# Patient Record
Sex: Female | Born: 1980 | Race: White | Hispanic: No | Marital: Married
Health system: Southern US, Community
[De-identification: ages and names within clinical notes are randomized; demographics above are authoritative.]

---

## 2007-07-17 ENCOUNTER — Ambulatory Visit: Payer: Self-pay | Admitting: Sports Medicine

## 2008-05-19 ENCOUNTER — Observation Stay: Payer: Self-pay | Admitting: Obstetrics and Gynecology

## 2008-06-14 ENCOUNTER — Observation Stay: Payer: Self-pay | Admitting: Obstetrics and Gynecology

## 2008-06-17 ENCOUNTER — Observation Stay: Payer: Self-pay | Admitting: Obstetrics and Gynecology

## 2008-06-21 ENCOUNTER — Observation Stay: Payer: Self-pay | Admitting: Obstetrics and Gynecology

## 2008-06-25 ENCOUNTER — Observation Stay: Payer: Self-pay | Admitting: Obstetrics and Gynecology

## 2008-06-28 ENCOUNTER — Observation Stay: Payer: Self-pay | Admitting: Obstetrics and Gynecology

## 2008-07-01 ENCOUNTER — Observation Stay: Payer: Self-pay

## 2008-07-05 ENCOUNTER — Observation Stay: Payer: Self-pay | Admitting: Obstetrics and Gynecology

## 2008-07-08 ENCOUNTER — Observation Stay: Payer: Self-pay | Admitting: Obstetrics and Gynecology

## 2008-07-12 ENCOUNTER — Observation Stay: Payer: Self-pay | Admitting: Obstetrics and Gynecology

## 2008-07-15 ENCOUNTER — Observation Stay: Payer: Self-pay

## 2008-07-19 ENCOUNTER — Observation Stay: Payer: Self-pay | Admitting: Obstetrics and Gynecology

## 2008-07-22 ENCOUNTER — Observation Stay: Payer: Self-pay

## 2008-07-26 ENCOUNTER — Observation Stay: Payer: Self-pay

## 2008-07-29 ENCOUNTER — Observation Stay: Payer: Self-pay | Admitting: Obstetrics and Gynecology

## 2008-08-01 ENCOUNTER — Observation Stay: Payer: Self-pay | Admitting: Obstetrics and Gynecology

## 2008-08-05 ENCOUNTER — Observation Stay: Payer: Self-pay | Admitting: Obstetrics and Gynecology

## 2008-08-09 ENCOUNTER — Observation Stay: Payer: Self-pay | Admitting: Obstetrics and Gynecology

## 2008-08-12 ENCOUNTER — Ambulatory Visit: Payer: Self-pay | Admitting: Obstetrics and Gynecology

## 2008-08-13 ENCOUNTER — Inpatient Hospital Stay: Payer: Self-pay | Admitting: Obstetrics and Gynecology

## 2012-04-07 ENCOUNTER — Encounter: Payer: Self-pay | Admitting: Obstetrics and Gynecology

## 2012-06-10 ENCOUNTER — Encounter: Payer: Self-pay | Admitting: Pediatrics

## 2012-08-06 ENCOUNTER — Observation Stay: Payer: Self-pay | Admitting: Obstetrics and Gynecology

## 2012-08-12 ENCOUNTER — Observation Stay: Payer: Self-pay | Admitting: Obstetrics and Gynecology

## 2012-08-20 ENCOUNTER — Observation Stay: Payer: Self-pay

## 2012-08-23 ENCOUNTER — Observation Stay: Payer: Self-pay | Admitting: Obstetrics and Gynecology

## 2012-08-26 ENCOUNTER — Observation Stay: Payer: Self-pay

## 2012-08-29 ENCOUNTER — Observation Stay: Payer: Self-pay | Admitting: Obstetrics and Gynecology

## 2012-09-03 ENCOUNTER — Observation Stay: Payer: Self-pay | Admitting: Obstetrics and Gynecology

## 2012-09-05 ENCOUNTER — Observation Stay: Payer: Self-pay | Admitting: Obstetrics and Gynecology

## 2012-09-09 ENCOUNTER — Observation Stay: Payer: Self-pay | Admitting: Obstetrics and Gynecology

## 2012-09-12 ENCOUNTER — Observation Stay: Payer: Self-pay

## 2012-09-16 ENCOUNTER — Observation Stay: Payer: Self-pay | Admitting: Obstetrics and Gynecology

## 2012-09-19 ENCOUNTER — Observation Stay: Payer: Self-pay

## 2012-09-23 ENCOUNTER — Observation Stay: Payer: Self-pay

## 2012-09-25 ENCOUNTER — Ambulatory Visit: Payer: Self-pay | Admitting: Obstetrics and Gynecology

## 2012-09-25 LAB — CBC WITH DIFFERENTIAL/PLATELET
Basophil #: 0 10*3/uL (ref 0.0–0.1)
Eosinophil %: 0.4 %
HCT: 37.3 % (ref 35.0–47.0)
HGB: 13.3 g/dL (ref 12.0–16.0)
Lymphocyte %: 19.8 %
MCH: 31.9 pg (ref 26.0–34.0)
MCHC: 35.7 g/dL (ref 32.0–36.0)
Monocyte #: 0.8 x10 3/mm (ref 0.2–0.9)
Monocyte %: 9.1 %
Neutrophil #: 6 10*3/uL (ref 1.4–6.5)
Platelet: 163 10*3/uL (ref 150–440)
RBC: 4.17 10*6/uL (ref 3.80–5.20)
RDW: 13.8 % (ref 11.5–14.5)
WBC: 8.5 10*3/uL (ref 3.6–11.0)

## 2012-09-26 ENCOUNTER — Inpatient Hospital Stay: Payer: Self-pay | Admitting: Obstetrics and Gynecology

## 2012-09-27 LAB — HEMATOCRIT: HCT: 33.9 % — ABNORMAL LOW (ref 35.0–47.0)

## 2014-04-08 ENCOUNTER — Ambulatory Visit: Payer: Self-pay | Admitting: Internal Medicine

## 2014-04-08 LAB — CBC CANCER CENTER
Basophil #: 0 x10 3/mm (ref 0.0–0.1)
Basophil %: 0.5 %
EOS PCT: 0.5 %
Eosinophil #: 0 x10 3/mm (ref 0.0–0.7)
HCT: 29 % — AB (ref 35.0–47.0)
HGB: 8.7 g/dL — AB (ref 12.0–16.0)
LYMPHS ABS: 1.5 x10 3/mm (ref 1.0–3.6)
LYMPHS PCT: 34 %
Lymphocyte %: 31.1 %
MCH: 19.4 pg — ABNORMAL LOW (ref 26.0–34.0)
MCHC: 30.1 g/dL — ABNORMAL LOW (ref 32.0–36.0)
MCV: 64 fL — AB (ref 80–100)
METAMYELOCYTE: 1 %
MONOS PCT: 9 %
MONOS PCT: 9 %
Monocyte #: 0.4 x10 3/mm (ref 0.2–0.9)
NEUTROS ABS: 2.8 x10 3/mm (ref 1.4–6.5)
Neutrophil %: 58.9 %
PLATELETS: 309 x10 3/mm (ref 150–440)
RBC: 4.5 10*6/uL (ref 3.80–5.20)
RDW: 18.4 % — ABNORMAL HIGH (ref 11.5–14.5)
Segmented Neutrophils: 56 %
WBC: 4.8 x10 3/mm (ref 3.6–11.0)

## 2014-04-08 LAB — RETICULOCYTES
Absolute Retic Count: 0.079 10*6/uL (ref 0.019–0.186)
RETICULOCYTE: 1.7 % (ref 0.4–3.1)

## 2014-04-08 LAB — FOLATE: FOLIC ACID: 19.4 ng/mL — AB (ref 3.1–17.5)

## 2014-04-08 LAB — IRON AND TIBC
IRON BIND. CAP.(TOTAL): 540 ug/dL — AB (ref 250–450)
IRON: 18 ug/dL — AB (ref 50–170)
Iron Saturation: 3 %
UNBOUND IRON-BIND. CAP.: 522 ug/dL

## 2014-04-08 LAB — LACTATE DEHYDROGENASE: LDH: 123 U/L (ref 81–246)

## 2014-04-22 LAB — CBC CANCER CENTER
Basophil #: 0 x10 3/mm (ref 0.0–0.1)
Basophil %: 0.3 %
EOS PCT: 0.4 %
Eosinophil #: 0 x10 3/mm (ref 0.0–0.7)
HCT: 36.4 % (ref 35.0–47.0)
HGB: 11.2 g/dL — ABNORMAL LOW (ref 12.0–16.0)
LYMPHS ABS: 1.5 x10 3/mm (ref 1.0–3.6)
LYMPHS PCT: 23.7 %
MCH: 21.5 pg — ABNORMAL LOW (ref 26.0–34.0)
MCHC: 30.7 g/dL — ABNORMAL LOW (ref 32.0–36.0)
MCV: 70 fL — AB (ref 80–100)
Monocyte #: 0.5 x10 3/mm (ref 0.2–0.9)
Monocyte %: 8.1 %
NEUTROS ABS: 4.2 x10 3/mm (ref 1.4–6.5)
Neutrophil %: 67.5 %
Platelet: 238 x10 3/mm (ref 150–440)
RBC: 5.19 10*6/uL (ref 3.80–5.20)
RDW: 26.5 % — AB (ref 11.5–14.5)
WBC: 6.3 x10 3/mm (ref 3.6–11.0)

## 2014-04-26 ENCOUNTER — Ambulatory Visit: Payer: Self-pay | Admitting: Internal Medicine

## 2014-07-16 NOTE — Op Note (Signed)
PATIENT NAME:  Cassandra Buck, GEPPERT MR#:  [id number] DATE OF BIRTH:  11-08-1980  DATE OF PROCEDURE:  09/25/2012  PREOPERATIVE DIAGNOSIS:  Twin gestation, 48 + 1 weeks' estimated gestational age.   POSTOPERATIVE DIAGNOSES: Twin gestation, 32 + 1 weeks' estimated gestational age.   PROCEDURE:  A repeat low transverse cesarean section.   ANESTHESIA:  Spinal.   SURGEON:  Suzy Bouchard, M.D.   FIRST ASSISTANT:  Milon Score, certified nurse midwife.   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 The patient's estimated date of confinement is 10/09/2012. The patient has elected for a repeat cesarean section.   PROCEDURE:  After adequate spinal anesthesia, the patient was placed in the dorsal supine position with a hip roll under the right side. The patient's abdomen was prepped and draped in normal sterile fashion. A Pfannenstiel incision was made 2 fingerbreadths above the symphysis pubis. Sharp dissection was used to identify the fascia. The fascia was opened in the midline and opened in a transverse fashion. The superior aspect of the fascia was grasped with Kocher clamps and the recti muscles dissected free. The inferior aspect of the fascia was grasped with Kocher clamps and the pyramidalis muscles were dissected free. Entry into the peritoneal cavity was accomplished sharply. The vesicouterine peritoneal fold was identified and opened, and a bladder flap was created, and the bladder was reflected inferiorly. A low transverse uterine incision was made. Upon entry into the first amniotic cavity, clear fluid resulted. The uterine incision was extended with blunt transverse traction. The fetal head was then delivered. A vigorous female was passed to nursery staff who assigned Apgar scores of 9 and 9. Cord blood was taken and a single clamp placed on placenta. A second bulging bag was noted. An amniotomy was performed, clear  fluid resulted, and the fetal head was brought to the incision and infant 2 was delivered after reducing a loose nuchal cord. A vigorous female was then passed to nursery staff who assigned Apgar scores of 9 and 9. Cord blood was taken and 2 clamps then placed on the umbilical cord. The placentas were then delivered. The uterus was exteriorized and the endometrial cavity was wiped clean with a laparotomy tape. The cervix was opened with a ring forceps, and the uterine incision was closed with 1 chromic suture in a running locking fashion with good approximation of edges and good hemostasis was noted. One additional figure-of-eight suture was required. The fallopian tubes and ovaries appeared normal. The posterior cul-de-sac was irrigated and suctioned, and the uterus was placed back into the abdominal cavity. The paracolic gutters were wiped clean with laparotomy tape. The uterine incision again appeared hemostatic. Interceed was placed over the uterine incision in a T-shaped fashion. And the superior aspect of the fascia was then grasped with Kocher clamps, and the On-Q pump catheters were advanced from an infraumbilical incision to a subfascial position. The fascia was then closed over top these with 0 Vicryl in a running nonlocking fashion with good approximation of edges. Good hemostasis was noted. The subcutaneous tissues were irrigated and bovied, and the skin was reapproximated with staples. Each On-Q pump catheter was then secured at the skin level and Steri-Stripped to the skin and Tegaderm placed over top this of these. Each catheter was loaded with 5 mL of 0.5% Marcaine. There were no complications. Estimated blood loss 500 mL. Intraoperative fluids 1500 mL. The patient tolerated the procedure well and  was taken to the Recovery Room in good condition.   ____________________________ Suzy Bouchardhomas J. Schermerhorn, MD [email or web address]:jm D: 09/26/2012 09:05:04 ET T: 09/26/2012 10:39:21 ET JOB#: [id number]  cc: Suzy Bouchardhomas J.  Schermerhorn, MD, <Dictator> Suzy BouchardHOMAS J SCHERMERHORN MD ELECTRONICALLY SIGNED 09/30/2012 9:23

## 2014-07-16 NOTE — Discharge Summary (Signed)
PATIENT NAME:  Cassandra Buck, Cassandra Buck MR#:  [id number] DATE OF BIRTH:  Aug 26, 1980  DATE OF ADMISSION:  09/26/2012 DATE OF DISCHARGE:  09/28/2012  PRINCIPAL PROCEDURE: Elective repeat cesarean section; twin delivery.   HOSPITAL COURSE: Uncomplicated. Postoperative day #1, hematocrit 33.9%. The patient was discharged to home in good condition. Vital signs stable. She will follow up with Dr. Feliberto GottronSchermerhorn in 2 weeks. Precautions were given to the patient.  ____________________________ Suzy Bouchardhomas J. Brittley Regner, MD [email or web address]:aw D: 10/13/2012 09:10:34 ET T: 10/13/2012 10:29:29 ET JOB#: [id number]  cc: Suzy Bouchardhomas J. Jori Frerichs, MD, <Dictator> Suzy BouchardHOMAS J Mechell Girgis MD ELECTRONICALLY SIGNED 10/14/2012 9:25

## 2017-05-23 ENCOUNTER — Other Ambulatory Visit: Payer: Self-pay | Admitting: Obstetrics and Gynecology

## 2017-05-23 DIAGNOSIS — Z1231 Encounter for screening mammogram for malignant neoplasm of breast: Secondary | ICD-10-CM

## 2017-06-06 ENCOUNTER — Encounter: Payer: Self-pay | Admitting: Radiology

## 2017-06-06 ENCOUNTER — Ambulatory Visit
Admission: RE | Admit: 2017-06-06 | Discharge: 2017-06-06 | Disposition: A | Discharge status: Home / Self Care | Payer: BLUE CROSS/BLUE SHIELD | Source: Ambulatory Visit | Attending: Obstetrics and Gynecology | Admitting: Obstetrics and Gynecology

## 2017-06-06 DIAGNOSIS — Z1231 Encounter for screening mammogram for malignant neoplasm of breast: Secondary | ICD-10-CM | POA: Diagnosis not present

## 2017-06-06 DIAGNOSIS — R928 Other abnormal and inconclusive findings on diagnostic imaging of breast: Secondary | ICD-10-CM | POA: Insufficient documentation

## 2017-06-18 ENCOUNTER — Other Ambulatory Visit: Payer: Self-pay | Admitting: Obstetrics and Gynecology

## 2017-06-18 DIAGNOSIS — R928 Other abnormal and inconclusive findings on diagnostic imaging of breast: Secondary | ICD-10-CM

## 2017-06-18 DIAGNOSIS — N6489 Other specified disorders of breast: Secondary | ICD-10-CM

## 2019-07-03 IMAGING — MG MM DIGITAL SCREENING BILAT W/ CAD
6 series · 6 of 6 positions shown · non-contrast
Comparison: None.

CLINICAL DATA: Screening.

EXAM:
DIGITAL SCREENING BILATERAL MAMMOGRAM WITH CAD

[L CC (1 of 2)]
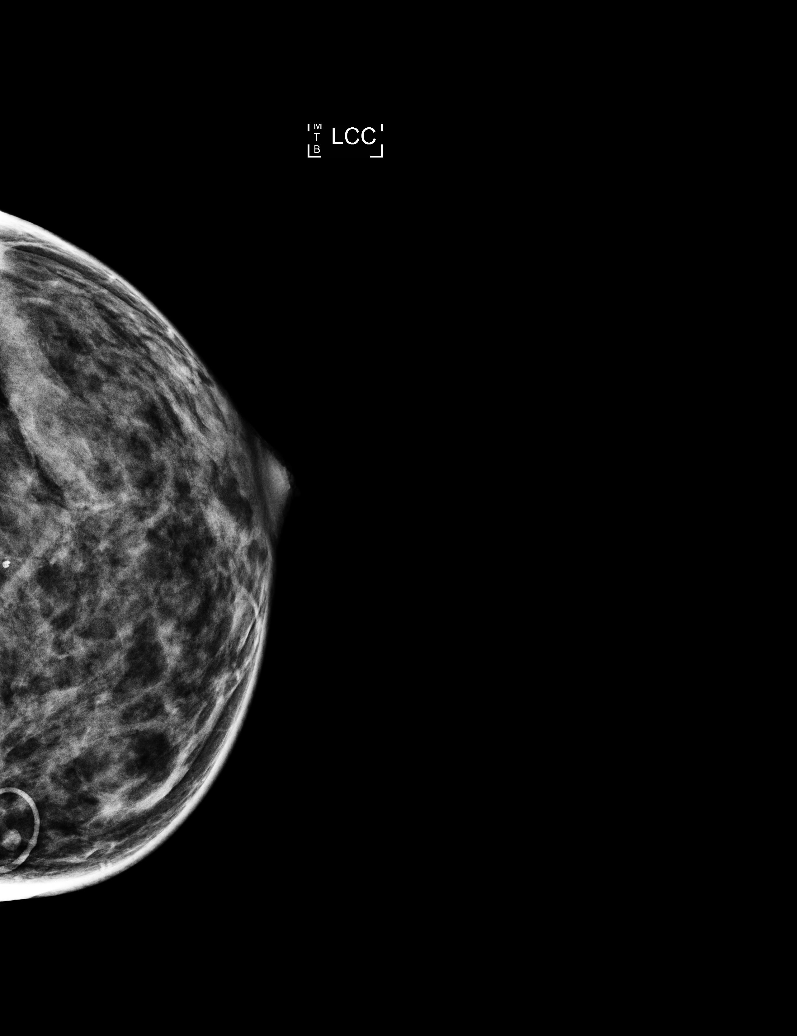

[R CC]
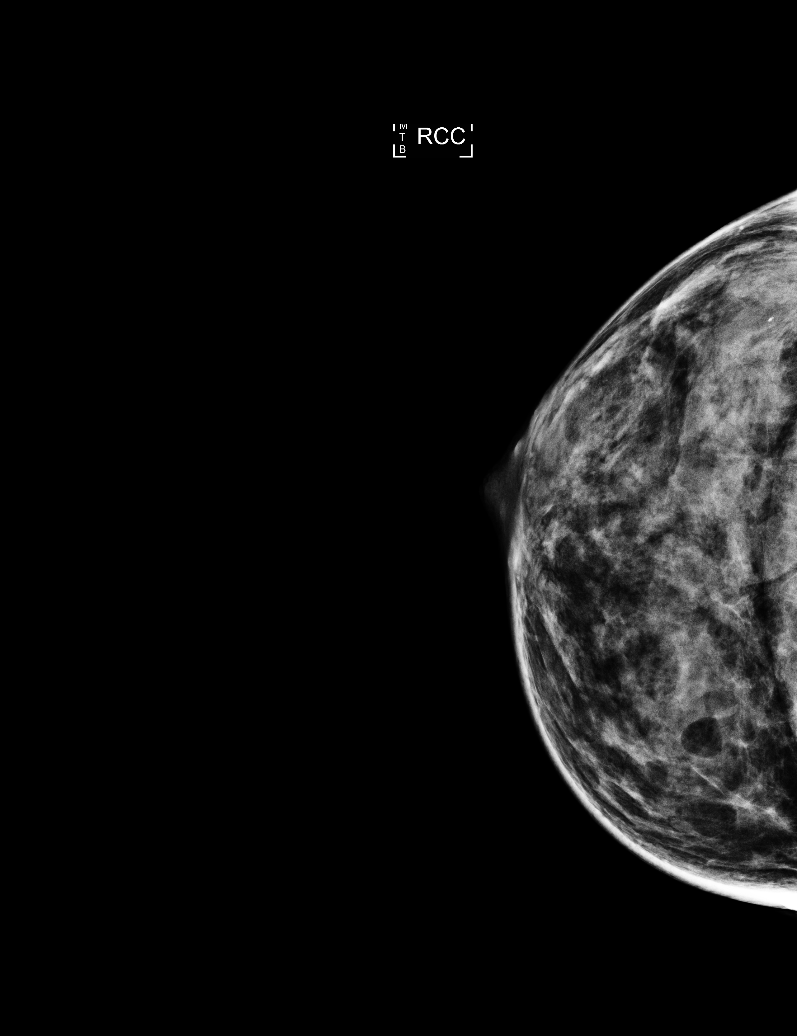

[L MLO (1 of 2)]
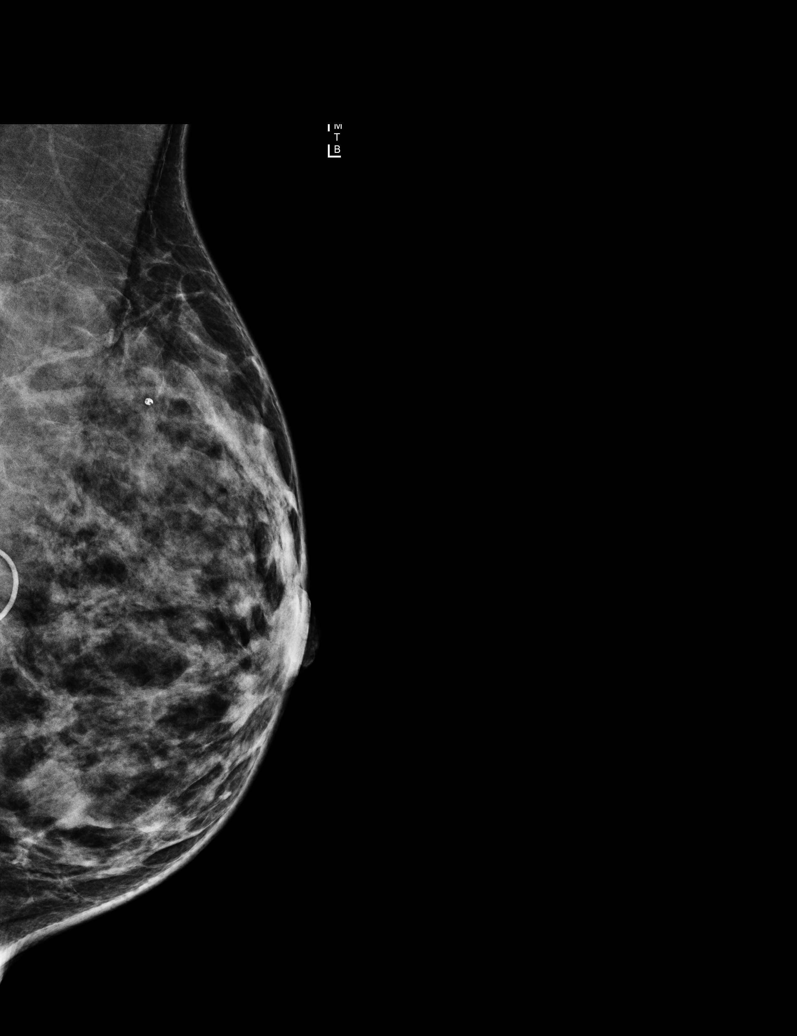

[R MLO]
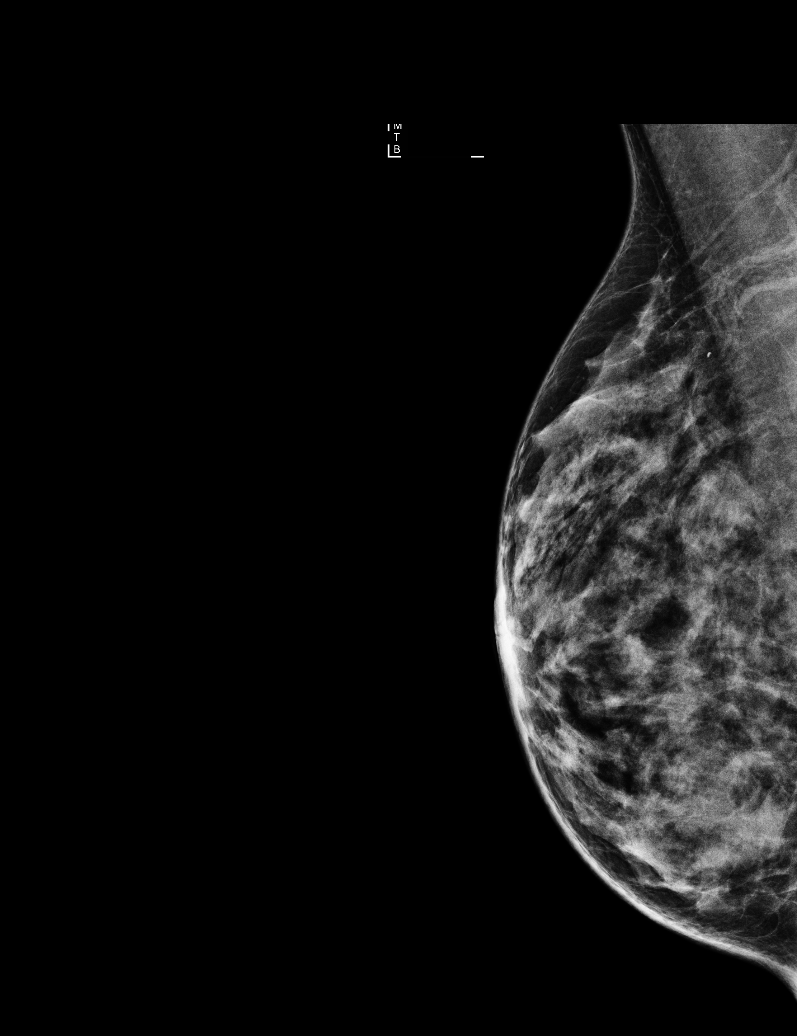

[L MLO (2 of 2)]
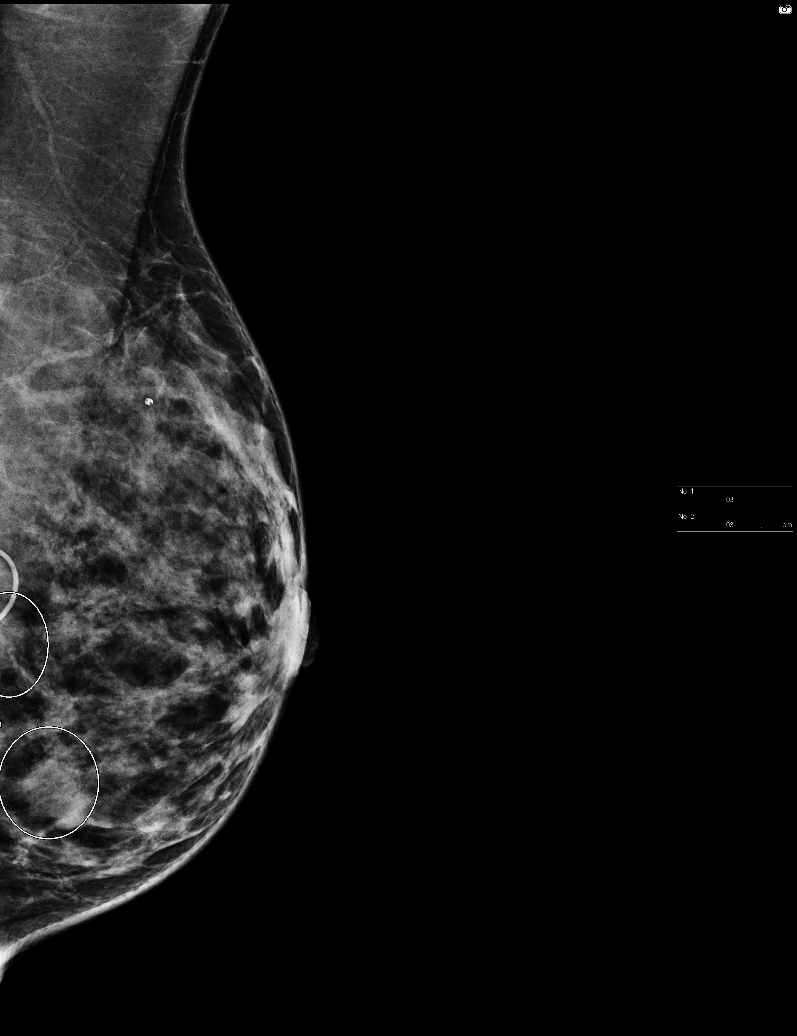

[L CC (2 of 2)]
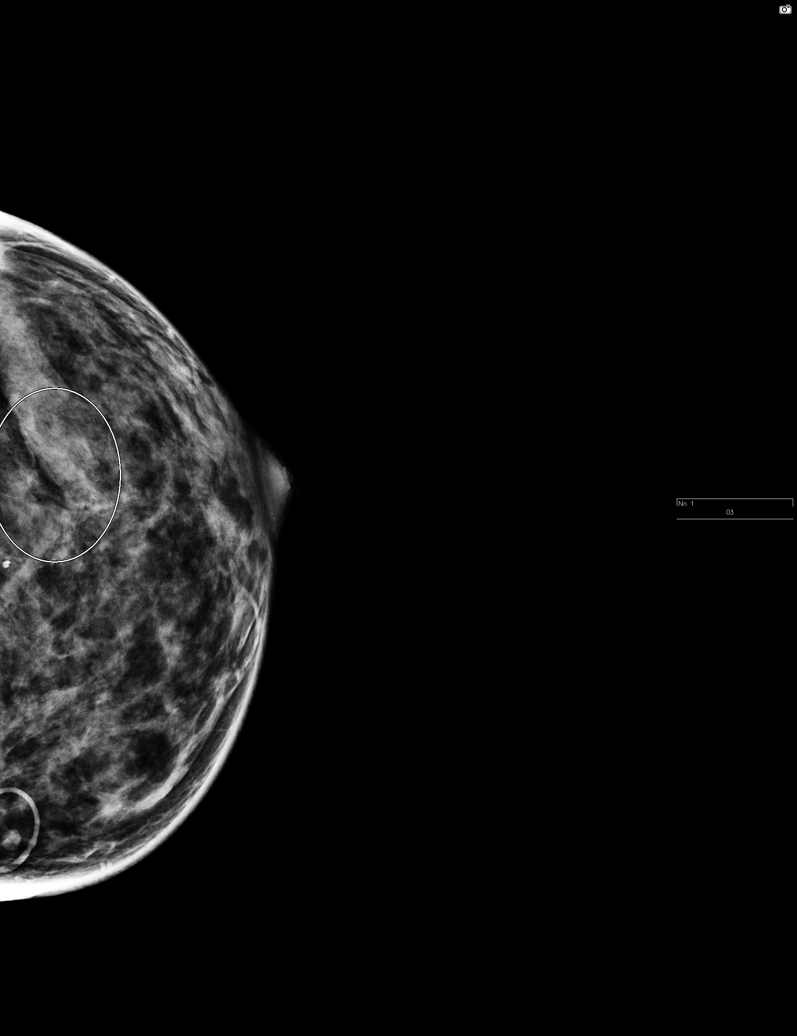

[6 of 6 positions shown; findings below may reference images not displayed]

ACR Breast Density Category c: The breast tissue is heterogeneously
dense, which may obscure small masses.
FINDINGS: In the left breast, a possible asymmetry warrants further
evaluation. In the right breast, no findings suspicious for
malignancy.

Images were processed with CAD.
IMPRESSION: Further evaluation is suggested for possible asymmetry in the left
breast.

RECOMMENDATION:
Diagnostic mammogram and possibly ultrasound of the left breast.
(Code:[id number]-[id number])

The patient will be contacted regarding the findings, and additional
imaging will be scheduled.

BI-RADS CATEGORY  0: Incomplete. Need additional imaging evaluation
and/or prior mammograms for comparison.
